# Patient Record
Sex: Male | Born: 1990 | Race: White | Hispanic: No | Marital: Single | Smoking: Never smoker
Health system: Southern US, Community
[De-identification: ages and names within clinical notes are randomized; demographics above are authoritative.]

---
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
## 2011-01-09 ENCOUNTER — Emergency Department (HOSPITAL_COMMUNITY)
Admission: EM | Admit: 2011-01-09 | Discharge: 2011-01-09 | Disposition: A | Discharge status: Home / Self Care | Payer: No Typology Code available for payment source | Attending: Emergency Medicine | Admitting: Emergency Medicine

## 2011-01-09 ENCOUNTER — Emergency Department (HOSPITAL_COMMUNITY): Payer: No Typology Code available for payment source

## 2011-01-09 DIAGNOSIS — M25579 Pain in unspecified ankle and joints of unspecified foot: Secondary | ICD-10-CM | POA: Insufficient documentation

## 2011-01-09 DIAGNOSIS — M79609 Pain in unspecified limb: Secondary | ICD-10-CM | POA: Insufficient documentation

## 2011-01-09 DIAGNOSIS — Y929 Unspecified place or not applicable: Secondary | ICD-10-CM | POA: Insufficient documentation

## 2011-01-09 DIAGNOSIS — M25569 Pain in unspecified knee: Secondary | ICD-10-CM | POA: Insufficient documentation

## 2011-01-09 DIAGNOSIS — S8000XA Contusion of unspecified knee, initial encounter: Secondary | ICD-10-CM | POA: Insufficient documentation

## 2011-01-09 DIAGNOSIS — IMO0002 Reserved for concepts with insufficient information to code with codable children: Secondary | ICD-10-CM | POA: Insufficient documentation

## 2013-11-19 ENCOUNTER — Emergency Department: Payer: Self-pay | Admitting: Emergency Medicine

## 2015-10-30 ENCOUNTER — Encounter: Payer: Self-pay | Admitting: Physician Assistant

## 2015-10-30 ENCOUNTER — Ambulatory Visit: Payer: Self-pay | Admitting: Physician Assistant

## 2015-10-30 VITALS — BP 110/80 | HR 70 | Temp 98.2°F

## 2015-10-30 DIAGNOSIS — H6982 Other specified disorders of Eustachian tube, left ear: Secondary | ICD-10-CM

## 2015-10-30 MED ORDER — FLUTICASONE PROPIONATE 50 MCG/ACT NA SUSP: 2.0000 | Freq: Every day | NASAL | Status: DC

## 2015-10-30 MED ORDER — PREDNISONE 10 MG PO TABS: 30.0000 mg | ORAL_TABLET | Freq: Every day | ORAL | Status: DC

## 2015-10-30 NOTE — Progress Notes (Signed)
S:  C/o ears popping and being stopped up, hearing is decreased;  no drainage from ears, no fever/chills, no cough or congestion, some sinus pressure, remainder ros neg Using otc meds without relief  O:  Vitals wnl, nad, tms dull b/l, nasal mucosa swollen, throat wnl, neck supple no lymph, lungs c t a, cv rrr, neuro intact  A: acute eustachean tube dysfunction  P: flonase, sudafed, prednisone 30mg  qd x 3d, return if not improving in 3 to 5 days, return earlier if worsening

## 2015-12-31 ENCOUNTER — Emergency Department
Admission: EM | Admit: 2015-12-31 | Discharge: 2015-12-31 | Disposition: A | Discharge status: Home / Self Care | Payer: Worker's Compensation | Attending: Emergency Medicine | Admitting: Emergency Medicine

## 2015-12-31 ENCOUNTER — Encounter: Payer: Self-pay | Admitting: Emergency Medicine

## 2015-12-31 DIAGNOSIS — Y9289 Other specified places as the place of occurrence of the external cause: Secondary | ICD-10-CM | POA: Diagnosis not present

## 2015-12-31 DIAGNOSIS — S300XXA Contusion of lower back and pelvis, initial encounter: Secondary | ICD-10-CM | POA: Diagnosis not present

## 2015-12-31 DIAGNOSIS — Z7952 Long term (current) use of systemic steroids: Secondary | ICD-10-CM | POA: Diagnosis not present

## 2015-12-31 DIAGNOSIS — Y990 Civilian activity done for income or pay: Secondary | ICD-10-CM | POA: Diagnosis not present

## 2015-12-31 DIAGNOSIS — W500XXA Accidental hit or strike by another person, initial encounter: Secondary | ICD-10-CM | POA: Diagnosis not present

## 2015-12-31 DIAGNOSIS — Z7951 Long term (current) use of inhaled steroids: Secondary | ICD-10-CM | POA: Diagnosis not present

## 2015-12-31 DIAGNOSIS — Z87891 Personal history of nicotine dependence: Secondary | ICD-10-CM | POA: Diagnosis not present

## 2015-12-31 DIAGNOSIS — Y9389 Activity, other specified: Secondary | ICD-10-CM | POA: Diagnosis not present

## 2015-12-31 DIAGNOSIS — S20221A Contusion of right back wall of thorax, initial encounter: Secondary | ICD-10-CM

## 2015-12-31 DIAGNOSIS — S3992XA Unspecified injury of lower back, initial encounter: Secondary | ICD-10-CM | POA: Diagnosis present

## 2015-12-31 MED ORDER — CYCLOBENZAPRINE HCL 10 MG PO TABS: 10.0000 mg | ORAL_TABLET | Freq: Three times a day (TID) | ORAL | Status: DC | PRN

## 2015-12-31 MED ORDER — HYDROCODONE-HOMATROPINE 5-1.5 MG/5ML PO SYRP: 5.0000 mL | ORAL_SOLUTION | Freq: Four times a day (QID) | ORAL | Status: DC | PRN

## 2015-12-31 MED ORDER — HYDROCODONE-ACETAMINOPHEN 5-325 MG PO TABS: 1.0000 | ORAL_TABLET | ORAL | Status: DC | PRN

## 2015-12-31 MED ORDER — IBUPROFEN 800 MG PO TABS: 800.0000 mg | ORAL_TABLET | Freq: Three times a day (TID) | ORAL | Status: DC | PRN
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
## 2015-12-31 NOTE — Discharge Instructions (Signed)
Contusion A contusion is a deep bruise. Contusions happen when an injury causes bleeding under the skin. Symptoms of bruising include pain, swelling, and discolored skin. The skin may turn blue, purple, or yellow. HOME CARE   Rest the injured area.  If told, put ice on the injured area.  Put ice in a plastic bag.  Place a towel between your skin and the bag.  Leave the ice on for 20 minutes, 2-3 times per day.  If told, put light pressure (compression) on the injured area using an elastic bandage. Make sure the bandage is not too tight. Remove it and put it back on as told by your doctor.  If possible, raise (elevate) the injured area above the level of your heart while you are sitting or lying down.  Take over-the-counter and prescription medicines only as told by your doctor. GET HELP IF:  Your symptoms do not get better after several days of treatment.  Your symptoms get worse.  You have trouble moving the injured area. GET HELP RIGHT AWAY IF:   You have very bad pain.  You have a loss of feeling (numbness) in a hand or foot.  Your hand or foot turns pale or cold.   This information is not intended to replace advice given to you by your health care provider. Make sure you discuss any questions you have with your health care provider.   Document Released: 03/21/2008 Document Revised: 06/24/2015 Document Reviewed: 02/18/2015 Elsevier Interactive Patient Education 2016 [employer]   Take pain medication as directed. Follow-up with occupational health if not improving or to return to the emergency room for any worsening symptoms.

## 2015-12-31 NOTE — ED Notes (Addendum)
Workers comp will be through [employer].  Chief of patient is on his way to the hospital.  Robley FriesMae Hill is the Human resources person to contact in regards to workers comp and can be reached at [phone]

## 2015-12-31 NOTE — ED Notes (Signed)
Per Delight Hohony Roof, Chief for [employer], patient is to receive a urine drug screen for workers comp.  This is because HR was unable to be reached after hours.
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
## 2015-12-31 NOTE — ED Notes (Signed)
Attempted to call HR for workers comp claim.  Facility is only open from 8-5. Will discuss workers comp with chief when he arrives to the hospital.

## 2016-05-26 ENCOUNTER — Ambulatory Visit: Payer: Self-pay | Admitting: Physician Assistant

## 2016-05-26 ENCOUNTER — Encounter: Payer: Self-pay | Admitting: Physician Assistant

## 2016-05-26 VITALS — BP 120/70 | HR 90 | Temp 98.5°F

## 2016-05-26 DIAGNOSIS — L259 Unspecified contact dermatitis, unspecified cause: Secondary | ICD-10-CM

## 2016-05-26 MED ORDER — DEXAMETHASONE SODIUM PHOSPHATE 10 MG/ML IJ SOLN
10.0000 mg | Freq: Once | INTRAMUSCULAR | Status: AC
  Administered 2016-05-26: 10 mg via INTRAMUSCULAR

## 2016-05-26 NOTE — Progress Notes (Signed)
S: c/o itchy rash on hands, arms, legs, was outside in yard and then broke out, sx for few weeks, tried multiple otc meds without relief, denies fever/chills  O: vitals wnl, nad, lungs c t a, cv rrr, skin with small raised red areas some with streaks/blisters, no drainage, n/v intact  A: acute contact dermatitis  P: decadron 10 mg IM, if not better by Monday will call in steroid pack

## 2017-05-16 ENCOUNTER — Encounter: Payer: Self-pay | Admitting: Physician Assistant

## 2017-05-16 ENCOUNTER — Ambulatory Visit: Payer: Self-pay | Admitting: Physician Assistant

## 2017-05-16 VITALS — BP 130/80 | HR 111 | Temp 98.5°F | Resp 16

## 2017-05-16 DIAGNOSIS — W57XXXA Bitten or stung by nonvenomous insect and other nonvenomous arthropods, initial encounter: Secondary | ICD-10-CM

## 2017-05-16 MED ORDER — DOXYCYCLINE HYCLATE 100 MG PO TABS: 100.0000 mg | ORAL_TABLET | Freq: Two times a day (BID) | ORAL | 0 refills | Status: AC

## 2017-05-16 NOTE — Progress Notes (Signed)
S: c/o red area on left thigh, put on a pair of pants and felt a stinging sensation, then felt another stinging sensation, area was red, got a little better, then today the area became more red and warm, denies fever/chills  O: vitals wnl, nad, skin with 2 red areas both size of 50cent pieces, no drainage or pustules, no vesicles, n/v intact  A: infected bug bite  P: doxy 100mg  bid

## 2018-01-01 ENCOUNTER — Encounter: Payer: Self-pay | Admitting: Family Medicine

## 2018-01-01 ENCOUNTER — Ambulatory Visit: Payer: Self-pay | Admitting: Family Medicine

## 2018-01-01 ENCOUNTER — Other Ambulatory Visit: Payer: Self-pay

## 2018-01-01 VITALS — BP 134/77 | HR 97 | Temp 98.6°F | Wt 194.0 lb

## 2018-01-01 DIAGNOSIS — M109 Gout, unspecified: Secondary | ICD-10-CM

## 2018-01-01 DIAGNOSIS — M79643 Pain in unspecified hand: Secondary | ICD-10-CM

## 2018-01-01 DIAGNOSIS — M6530 Trigger finger, unspecified finger: Secondary | ICD-10-CM

## 2018-01-01 MED ORDER — MELOXICAM 15 MG PO TABS: 15.0000 mg | ORAL_TABLET | Freq: Every day | ORAL | 0 refills | Status: AC

## 2018-01-01 MED ORDER — PREDNISONE 20 MG PO TABS: 40.0000 mg | ORAL_TABLET | Freq: Every day | ORAL | 0 refills | Status: AC

## 2018-01-01 NOTE — Progress Notes (Signed)
Georgiann HahnAaron J Bethards 27 y.o. male who present with 2 days of symptoms to his right hand. He describes his fingers and stuck and needing to be opened as well as pain and swelling. He reports that this pain is relieved by heat. He reports that the pain is sharp and interferes with his ADL's  Review of Systems  Constitutional: Negative.   HENT: Negative.   Eyes: Negative.   Respiratory: Negative.   Cardiovascular: Negative.   Gastrointestinal: Negative.   Genitourinary: Negative.   Musculoskeletal:       Hand pain  Skin: Negative.   Neurological: Negative.   Endo/Heme/Allergies: Negative.   Psychiatric/Behavioral: Negative.     O: Vitals:   01/01/18 1436  BP: 134/77  Pulse: 97  Temp: 98.6 F (37 C)  SpO2: 97%   Physical Exam  Constitutional: He is oriented to person, place, and time. Vital signs are normal. He appears well-developed and well-nourished.  HENT:  Head: Normocephalic.  Right Ear: External ear normal.  Left Ear: External ear normal.  Nose: Nose normal.  Mouth/Throat: Oropharynx is clear and moist.  Eyes: Pupils are equal, round, and reactive to light.  Neck: Normal range of motion.  Cardiovascular: Normal rate and regular rhythm.  Pulmonary/Chest: Effort normal and breath sounds normal.  Abdominal: Soft. Bowel sounds are normal.  Musculoskeletal: Normal range of motion.       Arms:      Hands: Reported fingers were bent about half way requiring manual opening and that he hand pain palmar side with palpation.  Neurological: He is alert and oriented to person, place, and time.  Skin: Skin is warm and dry.  Vitals reviewed.     A: 1. Pain of hand, unspecified laterality   2. Trigger finger of right hand, unspecified finger     P:  Discussed differential with patient who was advised to f/u with PCP for evaluation and treatment. Differential dx reviewed with patient and discussed suspected dx of trigger finger and etiology and typical treatment. Patient lab  [facility].  1. Pain of hand, unspecified laterality - meloxicam (MOBIC) 15 MG tablet; Take 1 tablet (15 mg total) by mouth daily.  2. Trigger finger of right hand, unspecified finger - predniSONE (DELTASONE) 20 MG tablet; Take 2 tablets (40 mg total) by mouth daily with breakfast. - meloxicam (MOBIC) 15 MG tablet; Take 1 tablet (15 mg total) by mouth daily.

## 2018-01-01 NOTE — Patient Instructions (Signed)
F/U with PCP for evaluation and treatment Will order labs that can be provided for supportive work up  Trigger Finger Trigger finger (stenosing tenosynovitis) is a condition that causes a finger to get stuck in a bent position. Each finger has a tough, cord-like tissue that connects muscle to bone (tendon), and each tendon is surrounded by a tunnel of tissue (tendon sheath). To move your finger, your tendon needs to slide freely through the sheath. Trigger finger happens when the tendon or the sheath thickens, making it difficult to move your finger. Trigger finger can affect any finger or a thumb. It may affect more than one finger. Mild cases may clear up with rest and medicine. Severe cases require more treatment. What are the causes? Trigger finger is caused by a thickened finger tendon or tendon sheath. The cause of this thickening is not known. What increases the risk? The following factors may make you more likely to develop this condition:  Doing activities that require a strong grip.  Having rheumatoid arthritis, gout, or diabetes.  Being 5740-621 years old.  Being a woman.  What are the signs or symptoms? Symptoms of this condition include:  Pain when bending or straightening your finger.  Tenderness or swelling where your finger attaches to the palm of your hand.  A lump in the palm of your hand or on the inside of your finger.  Hearing a popping sound when you try to straighten your finger.  Feeling a popping, catching, or locking sensation when you try to straighten your finger.  Being unable to straighten your finger.  How is this diagnosed? This condition is diagnosed based on your symptoms and a physical exam. How is this treated? This condition may be treated by:  Resting your finger and avoiding activities that make symptoms worse.  Wearing a finger splint to keep your finger in a slightly bent position.  Taking NSAIDs to relieve pain and  swelling.  Injecting medicine (steroids) into the tendon sheath to reduce swelling and irritation. Injections may need to be repeated.  Having surgery to open the tendon sheath. This may be done if other treatments do not work and you cannot straighten your finger. You may need physical therapy after surgery.  Follow these instructions at home:  Use moist heat to help reduce pain and swelling as told by your health care provider.  Rest your finger and avoid activities that make pain worse. Return to normal activities as told by your health care provider.  If you have a splint, wear it as told by your health care provider.  Take over-the-counter and prescription medicines only as told by your health care provider.  Keep all follow-up visits as told by your health care provider. This is important. Contact a health care provider if:  Your symptoms are not improving with home care. Summary  Trigger finger (stenosing tenosynovitis) causes your finger to get stuck in a bent position, and it can make it difficult and painful to straighten your finger.  This condition develops when a finger tendon or tendon sheath thickens.  Treatment starts with resting, wearing a splint, and taking NSAIDs.  In severe cases, surgery to open the tendon sheath may be needed. This information is not intended to replace advice given to you by your health care provider. Make sure you discuss any questions you have with your health care provider. Document Released: 07/23/2004 Document Revised: 09/13/2016 Document Reviewed: 09/13/2016 Elsevier Interactive Patient Education  2017 [employer]

## 2018-01-02 LAB — RHEUMATOID FACTOR

## 2018-01-02 LAB — SEDIMENTATION RATE

## 2018-01-02 LAB — URIC ACID: Uric Acid: 6.7 mg/dL (ref 3.7–8.6)

## 2018-01-04 ENCOUNTER — Telehealth: Payer: Self-pay | Admitting: Emergency Medicine

## 2018-01-04 NOTE — Telephone Encounter (Signed)
Spoke to patient in regards to follow up visit with [facility] stated that he doesn't feel as if he is getting any better and also [facility]. Delivered [facility] CMA of [facility]. Patient was informed

## 2022-09-02 ENCOUNTER — Other Ambulatory Visit: Payer: Self-pay
